# Patient Record
Sex: Female | Born: 1960 | Race: White | Hispanic: No | Marital: Married | Smoking: Never smoker
Health system: Southern US, Community
[De-identification: ages and names within clinical notes are randomized; demographics above are authoritative.]

## PROBLEM LIST (undated history)

## (undated) HISTORY — PX: RHINOPLASTY: SUR1284

---

## 2004-05-27 ENCOUNTER — Ambulatory Visit: Payer: Self-pay | Admitting: Gastroenterology

## 2008-06-22 ENCOUNTER — Ambulatory Visit: Payer: Self-pay | Admitting: Family Medicine

## 2009-09-23 ENCOUNTER — Ambulatory Visit: Payer: Self-pay | Admitting: Family Medicine

## 2010-04-08 ENCOUNTER — Ambulatory Visit: Payer: Self-pay | Admitting: Otolaryngology

## 2010-08-06 ENCOUNTER — Ambulatory Visit: Payer: Self-pay | Admitting: Family Medicine

## 2011-10-03 ENCOUNTER — Ambulatory Visit: Payer: Self-pay | Admitting: Family Medicine

## 2013-09-25 ENCOUNTER — Ambulatory Visit: Payer: Self-pay | Admitting: Family Medicine

## 2016-10-16 ENCOUNTER — Ambulatory Visit (INDEPENDENT_AMBULATORY_CARE_PROVIDER_SITE_OTHER): Payer: BC Managed Care – PPO

## 2016-10-16 ENCOUNTER — Ambulatory Visit
Admission: EM | Admit: 2016-10-16 | Discharge: 2016-10-16 | Disposition: A | Discharge status: Home / Self Care | Payer: BC Managed Care – PPO | Attending: Emergency Medicine | Admitting: Emergency Medicine

## 2016-10-16 ENCOUNTER — Ambulatory Visit: Payer: Self-pay | Admitting: Family Medicine

## 2016-10-16 DIAGNOSIS — M79662 Pain in left lower leg: Secondary | ICD-10-CM

## 2016-10-16 DIAGNOSIS — M898X6 Other specified disorders of bone, lower leg: Secondary | ICD-10-CM

## 2016-10-16 DIAGNOSIS — L237 Allergic contact dermatitis due to plants, except food: Secondary | ICD-10-CM | POA: Diagnosis not present

## 2016-10-16 DIAGNOSIS — R21 Rash and other nonspecific skin eruption: Secondary | ICD-10-CM

## 2016-10-16 MED ORDER — MUPIROCIN 2 % EX OINT: 1.0000 "application " | TOPICAL_OINTMENT | Freq: Three times a day (TID) | CUTANEOUS | 0 refills | Status: AC

## 2016-10-16 MED ORDER — IBUPROFEN 600 MG PO TABS: 600.0000 mg | ORAL_TABLET | Freq: Four times a day (QID) | ORAL | 0 refills | Status: AC | PRN

## 2016-10-16 MED ORDER — HYDROXYZINE HCL 25 MG PO TABS: 25.0000 mg | ORAL_TABLET | Freq: Four times a day (QID) | ORAL | 0 refills | Status: DC | PRN

## 2016-10-16 MED ORDER — PREDNISONE 20 MG PO TABS: ORAL_TABLET | ORAL | 0 refills | Status: DC

## 2016-10-16 NOTE — Discharge Instructions (Signed)
Use TecNu before going out in areas with known poison ivy/oak.  This will help prevent you from getting poison ivy/oak.  If you get a rash, you can use Zanfel or TecNu extreme to deactivate the oil, which will stop the rash from spreading and help with the itching.  Apply antibacterial ointment on scabbed areas to help prevent infection.  If you were given steroids, make sure you finish all of them.  You may take Claritin, Allegra, Zyrtec during the day, Benadryl at night. Dissolve 1 packet (or tablet) of Domeboro (aluminum acetate) in 1 pint of luke-warm water. Soak the affected areas with luke-warm Domeboro solution for 5-10 minutes twice daily. You may use apply gauze soaked in the domeboro.  Gently pat dry, Then apply the steriod / antibiotic cream. You may also take oatmeal baths with Aveeno oatmeal (1 cup in half full bathtub) or cornstarch/baking soda (1 cup each in half full bathtub). To prevent the oatmeal from caking in pipes, place it in a tied sock before dropping it into the bathtub.  For your ankle: Ace wrap, ice it for 20 minutes at a time, elevate it above your heart particularly at night.  Go to www.goodrx.com to look up your medications. This will give you a list of where you can find your prescriptions at the most affordable prices. Also, ask the pharmacist what the Nedra HaiC[employer] Price is. This is frequently much cheaper than going through insurance.

## 2016-10-16 NOTE — ED Triage Notes (Addendum)
Patient complains of rash that she noticed 3 days ago. Patient states that she may have got into some poision oak. Patient states that she has had some tingling sensation before rash appeared. Patient states that area keeps spreading.   Patient also reports of left ankle pain that occasionally swells x 2 weeks.

## 2016-10-16 NOTE — ED Provider Notes (Signed)
HPI  SUBJECTIVE:  Annette Young is a 56 y.o. female who presents with 2 complaints. First, she reports an itchy, burning rash 5 days ago. States it is located on her abdomen, lateral right hip and left neck. States that she has been working in the yard a lot recently. She describes the rash as like bumps and blisters . There are no alleviating factors. She has not tried anything for this. Some worse when she wears specific clothing. States it itches all day. It is not painful. No fevers, body aches, flulike symptoms, new lotions, soaps, detergents, foods. No meds, no sensation being bitten at night, blood on the bedclothes in the morning. No contacts with similar rash, no pets in the home.  Second, she reports lateral left ankle pain described as soreness, bruised, achy. She has increased her physical activity recently and is walking up to 10-12 miles a day. She states that she is functioning okay but she reports intermittent swelling laterally after a long day standing. She states the joint is nontender. No trauma, fall, erythema. Symptoms worse with plantar flexion/dorsiflexion/inversion. Symptoms are better with rest, ice. She has also tried soaking her foot in Epsom salts. States that her foot feels fine. Past medical history negative for diabetes, hypertension, history of injury to this ankle. PMD: She was a former patient of Dr. Yetta BarreJones, states that she can reestablish care with her.   History reviewed. No pertinent past medical history.  Past Surgical History:  Procedure Laterality Date  . RHINOPLASTY      Family History  Problem Relation Age of Onset  . Heart disease Mother   . Diabetes Father     Social History  Substance Use Topics  . Smoking status: Never Smoker  . Smokeless tobacco: Never Used  . Alcohol use Yes     Comment: occasionally    No current facility-administered medications for this encounter.   Current Outpatient Prescriptions:  .  mometasone (NASONEX) 50 MCG/ACT  nasal spray, Place 2 sprays into the nose daily., Disp: , Rfl:  .  hydrOXYzine (ATARAX/VISTARIL) 25 MG tablet, Take 1 tablet (25 mg total) by mouth every 6 (six) hours as needed for itching., Disp: 20 tablet, Rfl: 0 .  ibuprofen (ADVIL,MOTRIN) 600 MG tablet, Take 1 tablet (600 mg total) by mouth every 6 (six) hours as needed., Disp: 30 tablet, Rfl: 0 .  mupirocin ointment (BACTROBAN) 2 %, Apply 1 application topically 3 (three) times daily., Disp: 22 g, Rfl: 0 .  predniSONE (DELTASONE) 20 MG tablet, 3 Tabs PO Days 1-3, then 2 tabs PO Days 4-6, then 1 tab PO Day 7-9, then Half Tab PO Day 10-12, Disp: 20 tablet, Rfl: 0  Allergies  Allergen Reactions  . Chlorine      ROS  As noted in HPI.   Physical Exam  BP 136/78 (BP Location: Left Arm)   Pulse 70   Temp 98.5 F (36.9 C) (Oral)   Resp 18   Ht 5\' 3"  (1.6 m)   Wt 150 lb (68 kg)   SpO2 97%   BMI 26.57 kg/m   Constitutional: Well developed, well nourished, no acute distress Eyes:  EOMI, conjunctiva normal bilaterally HENT: Normocephalic, atraumatic,mucus membranes moist Respiratory: Normal inspiratory effort Cardiovascular: Normal rate GI: nondistended skin: Several scattered erythematous macular rash over her right thumb, abdomen, linear almost vesicular rash of her right lateral thigh. Positive papular nonerythematous rash appears almost be urticaria versus early vesicles on her left neck see pictures  Musculoskeletal:  L Ankle , Proximal fibula NT, Distal fibula tender, Medial malleolus NT,  Deltoid ligament medially NT ,  Lateral ligaments NT , ATFL laterally NT , posterior tablofibular ligament laterally NT , calcaneofibular ligament laterally NT ,  Achilles NT, calcaneus NT,  Proximal 5th metatarsal NT, Midfoot NT, distal NVI with baseline sensation / motor to foot. no pain with dorsiflexion/plantar flexion. no pain with inversion/eversion. - bruising. + Ant drawer test stable. Pt able to bear weight in  dept.  Neurologic: Alert & oriented x 3, no focal neuro deficits Psychiatric: Speech and behavior appropriate   ED Course   Medications - No data to display  Orders Placed This Encounter  Procedures  . DG Tibia/Fibula Left    Standing Status:   Standing    Number of Occurrences:   1    Order Specific Question:   Reason for Exam (SYMPTOM  OR DIAGNOSIS REQUIRED)    Answer:   distal lateral fibular tenderness r/o fx    No results found for this or any previous visit (from the past 24 hour(s)). Dg Tibia/fibula Left  Result Date: 10/16/2016 CLINICAL DATA:  Distal lateral fibular tenderness. EXAM: LEFT TIBIA AND FIBULA - 2 VIEW COMPARISON:  None. FINDINGS: There is no evidence of fracture or other focal bone lesions. Soft tissues are unremarkable. IMPRESSION: Negative. Electronically Signed   By: Charlett Nose M.D.   On: 10/16/2016 15:13    ED Clinical Impression  Allergic contact dermatitis due to plants, except food  Pain of left fibula  ED Assessment/Plan   1: Contact dermatitis. Home with 12 days prednisone, Atarax at night, or she can try Claritin or Zyrtec  2: Distal fibular pain. We'll x-ray to rule out any stress fractures given her increased physical activity. Her ankle is completely nontender to palpation and with range of motion deferring imaging of this. Advised Ace wrap, elevation, ice for 20 minutes at a time at the end of the long day when it is swollen, will send home with 600 mg ibuprofen with 1 g of Tylenol.  Imaging independently reviewed. No fracture or dislocation. No focal bone lesions . See radiology report for details.  Patient to reestablish care with Dr. Yetta Barre.  Discussed imaging, MDM, plan and followup with patient.  Patient agrees with plan.   Meds ordered this encounter  Medications  . mometasone (NASONEX) 50 MCG/ACT nasal spray    Sig: Place 2 sprays into the nose daily.  . predniSONE (DELTASONE) 20 MG tablet    Sig: 3 Tabs PO Days 1-3, then 2  tabs PO Days 4-6, then 1 tab PO Day 7-9, then Half Tab PO Day 10-12    Dispense:  20 tablet    Refill:  0  . hydrOXYzine (ATARAX/VISTARIL) 25 MG tablet    Sig: Take 1 tablet (25 mg total) by mouth every 6 (six) hours as needed for itching.    Dispense:  20 tablet    Refill:  0  . mupirocin ointment (BACTROBAN) 2 %    Sig: Apply 1 application topically 3 (three) times daily.    Dispense:  22 g    Refill:  0  . ibuprofen (ADVIL,MOTRIN) 600 MG tablet    Sig: Take 1 tablet (600 mg total) by mouth every 6 (six) hours as needed.    Dispense:  30 tablet    Refill:  0    *This clinic note was created using Scientist, clinical (histocompatibility and immunogenetics). Therefore, there may be occasional mistakes despite careful  proofreading.  ?   Domenick Gong, MD 10/16/16 1645

## 2017-03-08 ENCOUNTER — Other Ambulatory Visit: Payer: Self-pay | Admitting: Obstetrics and Gynecology

## 2017-03-08 DIAGNOSIS — Z1239 Encounter for other screening for malignant neoplasm of breast: Secondary | ICD-10-CM

## 2017-04-04 ENCOUNTER — Ambulatory Visit
Admission: RE | Admit: 2017-04-04 | Discharge: 2017-04-04 | Disposition: A | Discharge status: Home / Self Care | Payer: BC Managed Care – PPO | Source: Ambulatory Visit | Attending: Obstetrics and Gynecology | Admitting: Obstetrics and Gynecology

## 2017-04-04 ENCOUNTER — Encounter (INDEPENDENT_AMBULATORY_CARE_PROVIDER_SITE_OTHER): Payer: Self-pay

## 2017-04-04 DIAGNOSIS — Z1231 Encounter for screening mammogram for malignant neoplasm of breast: Secondary | ICD-10-CM | POA: Insufficient documentation

## 2017-04-04 DIAGNOSIS — Z1239 Encounter for other screening for malignant neoplasm of breast: Secondary | ICD-10-CM

## 2017-04-11 ENCOUNTER — Other Ambulatory Visit: Payer: Self-pay | Admitting: Obstetrics and Gynecology

## 2017-04-11 DIAGNOSIS — N6489 Other specified disorders of breast: Secondary | ICD-10-CM

## 2017-04-11 DIAGNOSIS — R928 Other abnormal and inconclusive findings on diagnostic imaging of breast: Secondary | ICD-10-CM

## 2017-04-19 ENCOUNTER — Ambulatory Visit
Admission: RE | Admit: 2017-04-19 | Discharge: 2017-04-19 | Disposition: A | Discharge status: Home / Self Care | Payer: BC Managed Care – PPO | Source: Ambulatory Visit | Attending: Obstetrics and Gynecology | Admitting: Obstetrics and Gynecology

## 2017-04-19 DIAGNOSIS — R928 Other abnormal and inconclusive findings on diagnostic imaging of breast: Secondary | ICD-10-CM

## 2017-04-19 DIAGNOSIS — N6489 Other specified disorders of breast: Secondary | ICD-10-CM

## 2018-03-07 DIAGNOSIS — B009 Herpesviral infection, unspecified: Secondary | ICD-10-CM | POA: Insufficient documentation

## 2018-03-20 ENCOUNTER — Other Ambulatory Visit: Payer: Self-pay | Admitting: Family Medicine

## 2018-03-20 DIAGNOSIS — Z1231 Encounter for screening mammogram for malignant neoplasm of breast: Secondary | ICD-10-CM

## 2018-05-08 ENCOUNTER — Ambulatory Visit
Admission: RE | Admit: 2018-05-08 | Discharge: 2018-05-08 | Disposition: A | Discharge status: Home / Self Care | Payer: BC Managed Care – PPO | Source: Ambulatory Visit | Attending: Family Medicine | Admitting: Family Medicine

## 2018-05-08 DIAGNOSIS — Z1231 Encounter for screening mammogram for malignant neoplasm of breast: Secondary | ICD-10-CM | POA: Insufficient documentation

## 2018-11-07 ENCOUNTER — Other Ambulatory Visit: Payer: Self-pay | Admitting: Family Medicine

## 2018-11-07 DIAGNOSIS — Z20822 Contact with and (suspected) exposure to covid-19: Secondary | ICD-10-CM

## 2018-11-12 LAB — NOVEL CORONAVIRUS, NAA: SARS-CoV-2, NAA: NOT DETECTED

## 2019-03-20 ENCOUNTER — Other Ambulatory Visit: Payer: Self-pay | Admitting: Obstetrics and Gynecology

## 2019-03-20 DIAGNOSIS — Z1231 Encounter for screening mammogram for malignant neoplasm of breast: Secondary | ICD-10-CM

## 2019-05-12 ENCOUNTER — Ambulatory Visit
Admission: RE | Admit: 2019-05-12 | Discharge: 2019-05-12 | Disposition: A | Discharge status: Home / Self Care | Payer: BC Managed Care – PPO | Source: Ambulatory Visit | Attending: Obstetrics and Gynecology | Admitting: Obstetrics and Gynecology

## 2019-05-12 ENCOUNTER — Other Ambulatory Visit: Payer: Self-pay

## 2019-05-12 DIAGNOSIS — Z1231 Encounter for screening mammogram for malignant neoplasm of breast: Secondary | ICD-10-CM | POA: Diagnosis present

## 2021-10-11 IMAGING — MG DIGITAL SCREENING BILAT W/ TOMO W/ CAD
8 series · 8 of 24 positions shown · non-contrast
Comparison: Previous exam(s).

CLINICAL DATA: Screening.

EXAM:
DIGITAL SCREENING BILATERAL MAMMOGRAM WITH TOMO AND CAD

[R CC synth-2D]
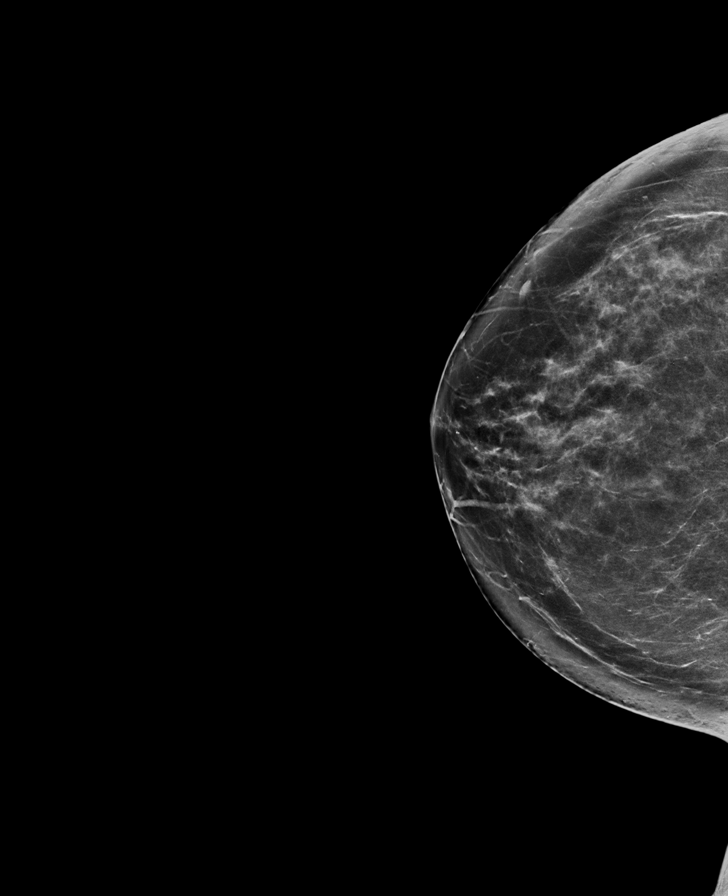

[L MLO synth-2D]
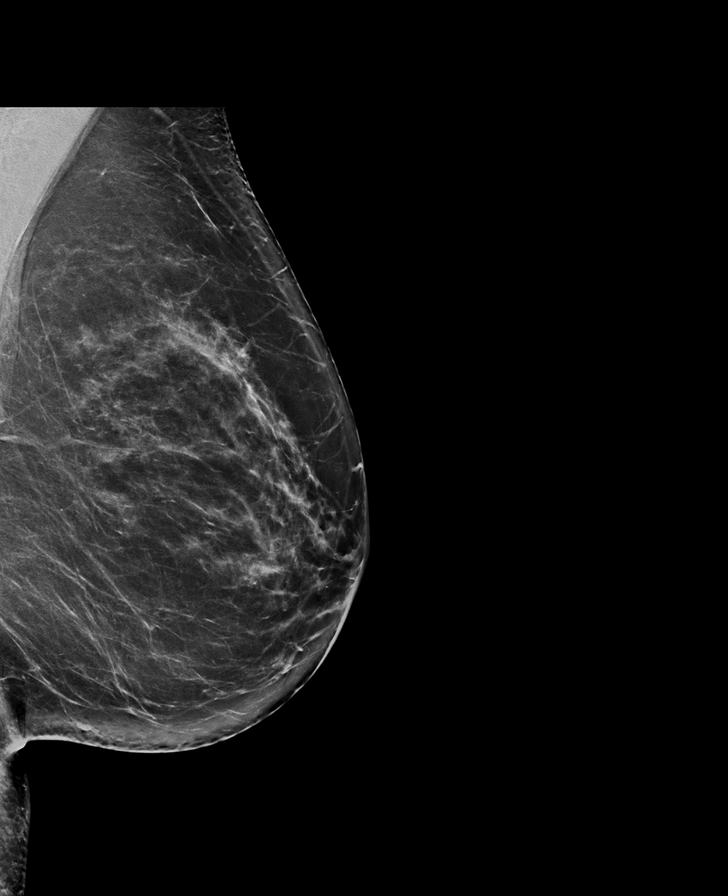

[R MLO synth-2D]
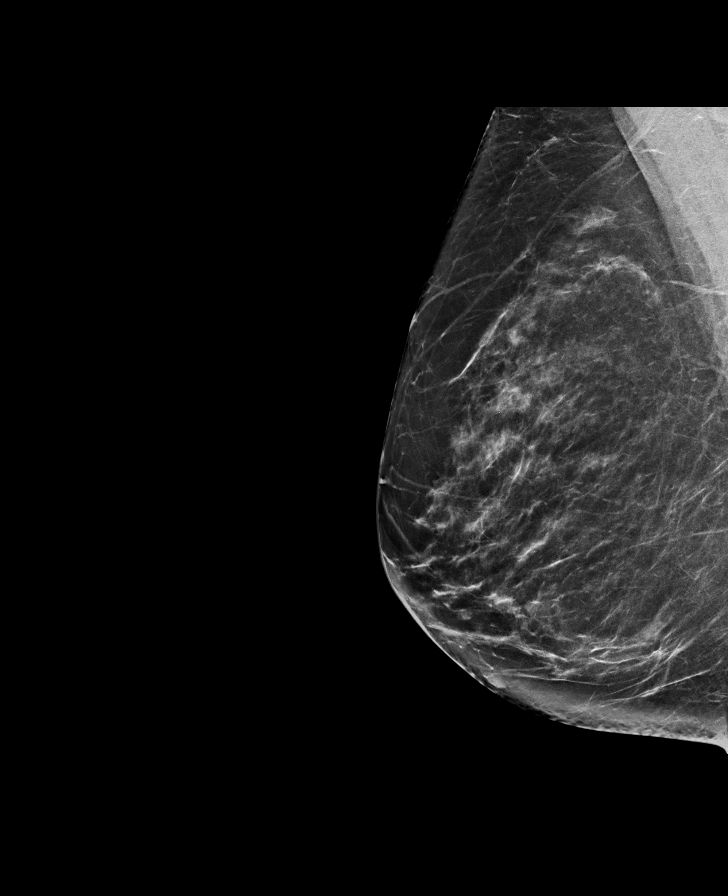

[L CC synth-2D]
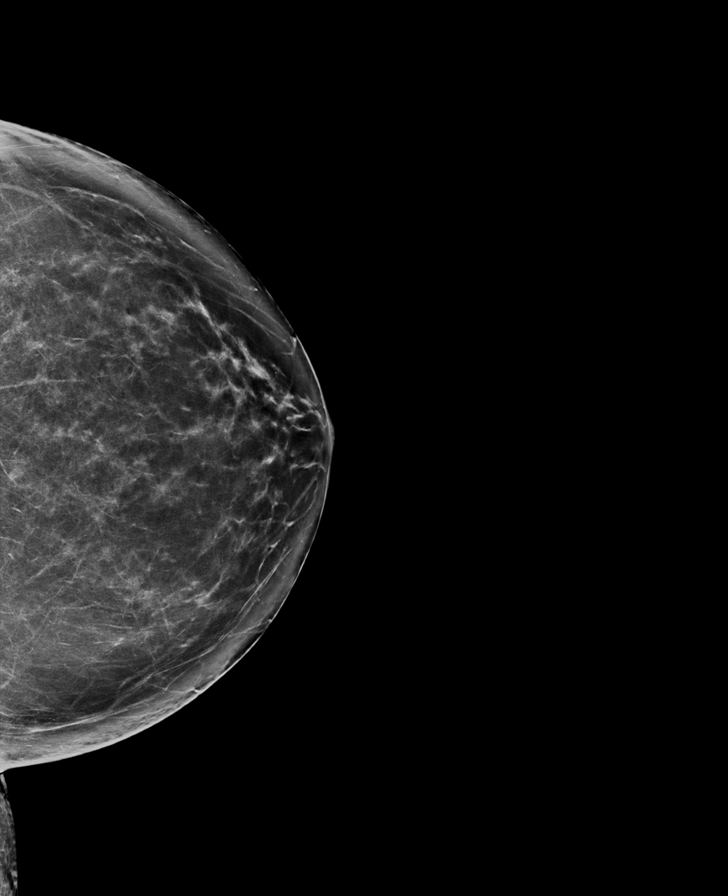

[L CC tomo · tomo slice 42/83.0]
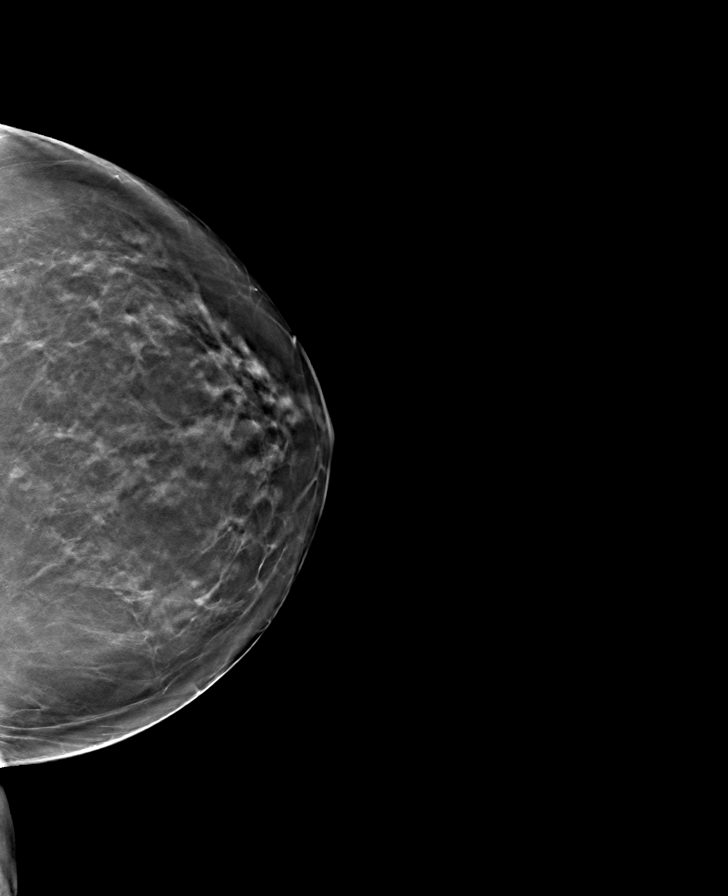

[R CC tomo · tomo slice 41/81.0]
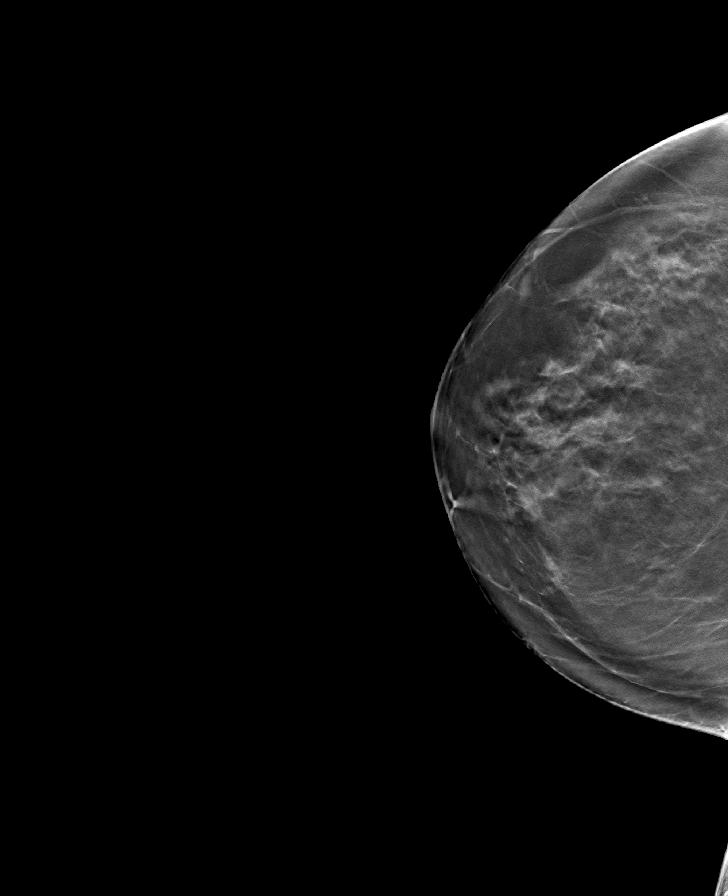

[L MLO tomo · tomo slice 43/84.0]
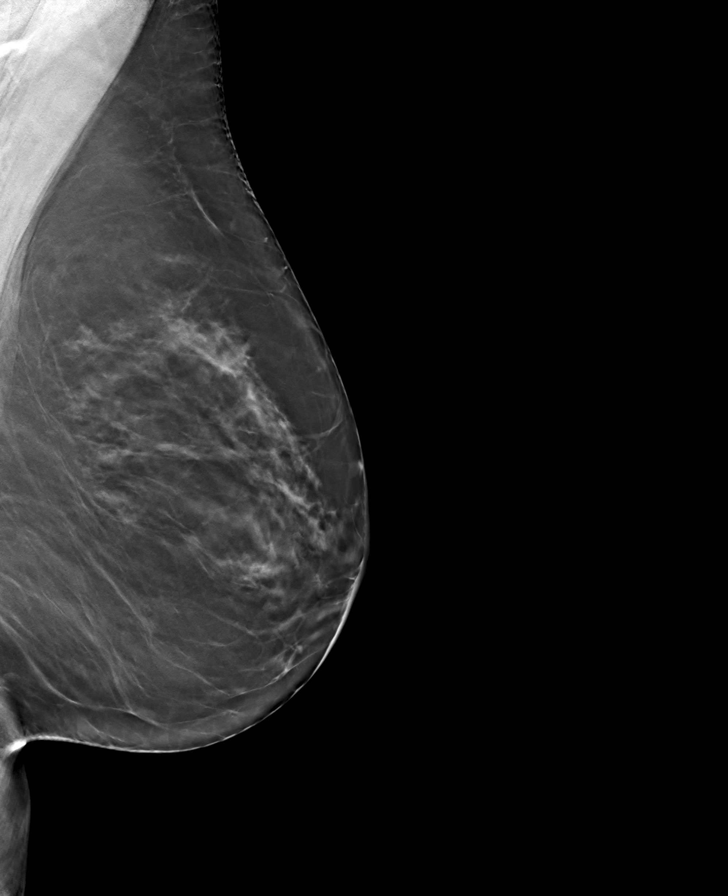

[R MLO tomo · tomo slice 41/82.0]
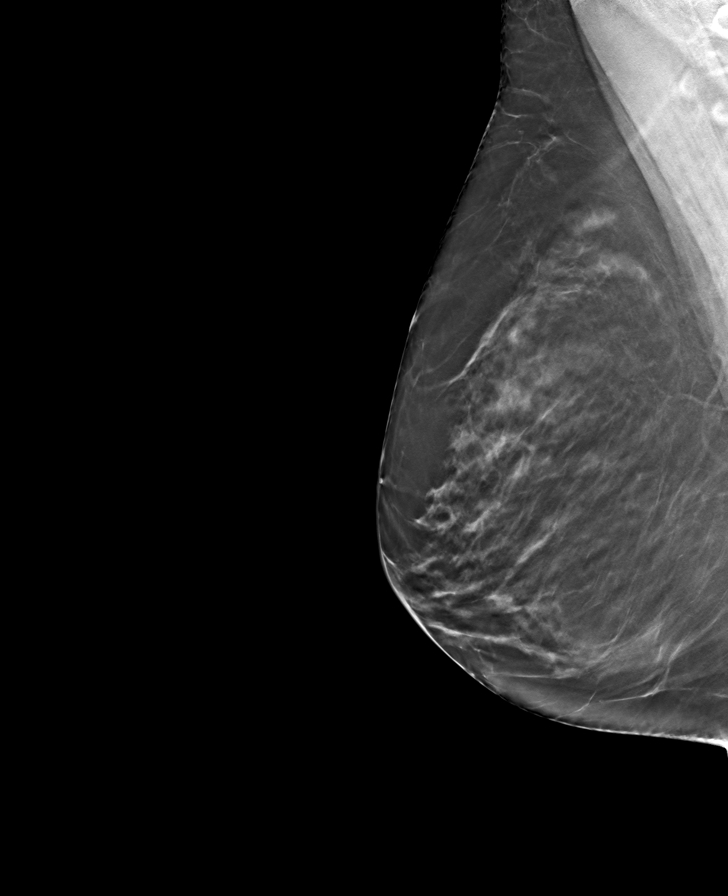

[8 of 24 positions shown; findings below may reference images not displayed]

ACR Breast Density Category b: There are scattered areas of
fibroglandular density.
FINDINGS: There are no findings suspicious for malignancy. Images were
processed with CAD.
IMPRESSION: No mammographic evidence of malignancy. A result letter of this
screening mammogram will be mailed directly to the patient.

RECOMMENDATION:
Screening mammogram in one year. (Code:[id number]-775)

BI-RADS CATEGORY  1: Negative.

## 2022-05-09 ENCOUNTER — Other Ambulatory Visit: Payer: Self-pay

## 2022-05-09 DIAGNOSIS — Z1231 Encounter for screening mammogram for malignant neoplasm of breast: Secondary | ICD-10-CM

## 2022-09-26 ENCOUNTER — Ambulatory Visit
Admission: RE | Admit: 2022-09-26 | Discharge: 2022-09-26 | Disposition: A | Discharge status: Home / Self Care | Payer: BC Managed Care – PPO | Source: Ambulatory Visit | Attending: Obstetrics and Gynecology | Admitting: Obstetrics and Gynecology

## 2022-09-26 DIAGNOSIS — Z1231 Encounter for screening mammogram for malignant neoplasm of breast: Secondary | ICD-10-CM

## 2022-10-25 ENCOUNTER — Encounter: Payer: Self-pay | Admitting: *Deleted

## 2023-08-13 ENCOUNTER — Other Ambulatory Visit: Payer: Self-pay | Admitting: Obstetrics and Gynecology

## 2023-08-13 DIAGNOSIS — Z1231 Encounter for screening mammogram for malignant neoplasm of breast: Secondary | ICD-10-CM

## 2023-09-26 ENCOUNTER — Telehealth: Payer: Self-pay

## 2023-09-26 NOTE — Telephone Encounter (Signed)
 Pt requesting call back to reschedule colonoscopy

## 2023-09-27 ENCOUNTER — Telehealth: Payer: Self-pay

## 2023-09-27 ENCOUNTER — Ambulatory Visit
Admission: RE | Admit: 2023-09-27 | Discharge: 2023-09-27 | Disposition: A | Discharge status: Home / Self Care | Payer: Self-pay | Source: Ambulatory Visit | Attending: Obstetrics and Gynecology | Admitting: Obstetrics and Gynecology

## 2023-09-27 ENCOUNTER — Other Ambulatory Visit: Payer: Self-pay

## 2023-09-27 DIAGNOSIS — Z1211 Encounter for screening for malignant neoplasm of colon: Secondary | ICD-10-CM

## 2023-09-27 DIAGNOSIS — E785 Hyperlipidemia, unspecified: Secondary | ICD-10-CM | POA: Insufficient documentation

## 2023-09-27 DIAGNOSIS — R195 Other fecal abnormalities: Secondary | ICD-10-CM

## 2023-09-27 DIAGNOSIS — Z1231 Encounter for screening mammogram for malignant neoplasm of breast: Secondary | ICD-10-CM | POA: Insufficient documentation

## 2023-09-27 MED ORDER — NA SULFATE-K SULFATE-MG SULF 17.5-3.13-1.6 GM/177ML PO SOLN: 1.0000 | Freq: Once | ORAL | 0 refills | Status: AC

## 2023-09-27 NOTE — Telephone Encounter (Signed)
 Gastroenterology Pre-Procedure Review  Request Date: 10/16/23 Requesting Physician: Dr. Ole Berkeley  PATIENT REVIEW QUESTIONS: The patient responded to the following health history questions as indicated:    1. Are you having any GI issues? Positive cologuard, 1st colonoscopy 2. Do you have a personal history of Polyps? no 3. Do you have a family history of Colon Cancer or Polyps? no 4. Diabetes Mellitus? no 5. Joint replacements in the past 12 months?no 6. Major health problems in the past 3 months?no 7. Any artificial heart valves, MVP, or defibrillator?no    MEDICATIONS & ALLERGIES:    Patient reports the following regarding taking any anticoagulation/antiplatelet therapy:   Plavix, Coumadin, Eliquis, Xarelto, Lovenox, Pradaxa, Brilinta, or Effient? no Aspirin? no  Patient confirms/reports the following medications:  Current Outpatient Medications  Medication Sig Dispense Refill   Na Sulfate-K Sulfate-Mg Sulfate concentrate (SUPREP) 17.5-3.13-1.6 GM/177ML SOLN Take 1 kit (354 mLs total) by mouth once for 1 dose. 354 mL 0   hydrOXYzine  (ATARAX /VISTARIL ) 25 MG tablet Take 1 tablet (25 mg total) by mouth every 6 (six) hours as needed for itching. 20 tablet 0   ibuprofen  (ADVIL ,MOTRIN ) 600 MG tablet Take 1 tablet (600 mg total) by mouth every 6 (six) hours as needed. 30 tablet 0   mometasone (NASONEX) 50 MCG/ACT nasal spray Place 2 sprays into the nose daily.     mupirocin  ointment (BACTROBAN ) 2 % Apply 1 application topically 3 (three) times daily. 22 g 0   predniSONE  (DELTASONE ) 20 MG tablet 3 Tabs PO Days 1-3, then 2 tabs PO Days 4-6, then 1 tab PO Day 7-9, then Half Tab PO Day 10-12 20 tablet 0   No current facility-administered medications for this visit.    Patient confirms/reports the following allergies:  Allergies  Allergen Reactions   Chlorine     No orders of the defined types were placed in this encounter.   AUTHORIZATION INFORMATION Primary Insurance: 1D#: Group  #:  Secondary Insurance: 1D#: Group #:  SCHEDULE INFORMATION: Date: 10/16/23 Time: Location: [facility]

## 2023-09-27 NOTE — Telephone Encounter (Signed)
 Phone call returned to schedule patient for her colonoscopy.  LVM for her to call my desk to schedule.  Thanks, Chitina, CMA

## 2023-10-03 NOTE — Telephone Encounter (Signed)
 okay

## 2023-10-09 ENCOUNTER — Encounter: Payer: Self-pay | Admitting: Gastroenterology

## 2023-10-16 ENCOUNTER — Encounter
Admission: RE | Disposition: A | Discharge status: Home / Self Care | Payer: Self-pay | Source: Home / Self Care | Attending: Gastroenterology

## 2023-10-16 ENCOUNTER — Ambulatory Visit
Admission: RE | Admit: 2023-10-16 | Discharge: 2023-10-16 | Disposition: A | Discharge status: Home / Self Care | Attending: Gastroenterology | Admitting: Gastroenterology

## 2023-10-16 ENCOUNTER — Encounter: Payer: Self-pay | Admitting: Gastroenterology

## 2023-10-16 ENCOUNTER — Other Ambulatory Visit: Payer: Self-pay

## 2023-10-16 ENCOUNTER — Ambulatory Visit

## 2023-10-16 DIAGNOSIS — D124 Benign neoplasm of descending colon: Secondary | ICD-10-CM | POA: Insufficient documentation

## 2023-10-16 DIAGNOSIS — K635 Polyp of colon: Secondary | ICD-10-CM | POA: Diagnosis not present

## 2023-10-16 DIAGNOSIS — R195 Other fecal abnormalities: Secondary | ICD-10-CM | POA: Diagnosis not present

## 2023-10-16 DIAGNOSIS — K641 Second degree hemorrhoids: Secondary | ICD-10-CM | POA: Insufficient documentation

## 2023-10-16 DIAGNOSIS — Z1211 Encounter for screening for malignant neoplasm of colon: Secondary | ICD-10-CM | POA: Insufficient documentation

## 2023-10-16 SURGERY — COLONOSCOPY
Anesthesia: General

## 2023-10-16 MED ORDER — LIDOCAINE HCL (CARDIAC) PF 100 MG/5ML IV SOSY
PREFILLED_SYRINGE | INTRAVENOUS | Status: DC | PRN
  Administered 2023-10-16: 50 mg via INTRAVENOUS

## 2023-10-16 MED ORDER — SODIUM CHLORIDE 0.9 % IV SOLN: INTRAVENOUS | Status: DC

## 2023-10-16 MED ORDER — PROPOFOL 10 MG/ML IV BOLUS
INTRAVENOUS | Status: DC | PRN
  Administered 2023-10-16 (×2): 50 mg via INTRAVENOUS

## 2023-10-16 MED ORDER — PROPOFOL 10 MG/ML IV BOLUS
INTRAVENOUS | Status: AC
  Filled 2023-10-16: qty 40

## 2023-10-16 MED ORDER — PROPOFOL 500 MG/50ML IV EMUL
INTRAVENOUS | Status: DC | PRN
  Administered 2023-10-16: 150 ug/kg/min via INTRAVENOUS

## 2023-10-16 NOTE — Op Note (Signed)
 [facility]: [facility] Patient Name: Annette Young Procedure Date: 10/16/2023 8:30 AM MRN: [id number] Account #: [id number] Date of Birth: November 06, 1960 Admit Type: Outpatient Age: 63 Room: [facility] ENDO ROOM 4 Gender: Female Note Status: Finalized Instrument Name: Charlyn Cooley [id number] Procedure:             Colonoscopy Indications:           Incidental - Positive Cologuard test, Screening for                         colorectal malignant neoplasm Providers:             [name] MD, MD Referring MD:          Clarise Crooks, MD (Referring MD) Medicines:             Propofol per Anesthesia Complications:         No immediate complications. Procedure:             Pre-Anesthesia Assessment:                        - Prior to the procedure, a History and Physical was                         performed, and patient medications and allergies were                         reviewed. The patient's tolerance of previous                         anesthesia was also reviewed. The risks and benefits                         of the procedure and the sedation options and risks                         were discussed with the patient. All questions were                         answered, and informed consent was obtained. Prior                         Anticoagulants: The patient has taken no anticoagulant                         or antiplatelet agents. ASA Grade Assessment: II - A                         patient with mild systemic disease. After reviewing                         the risks and benefits, the patient was deemed in                         satisfactory condition to undergo the procedure.                        After obtaining informed consent, the colonoscope was  passed under direct vision. Throughout the procedure,                         the patient's blood pressure, pulse, and oxygen                         saturations were monitored continuously. The                          Colonoscope was introduced through the anus and                         advanced to the the cecum, identified by appendiceal                         orifice and ileocecal valve. The colonoscopy was                         performed without difficulty. The patient tolerated                         the procedure well. The quality of the bowel                         preparation was excellent. Findings:      The perianal and digital rectal examinations were normal.      Five sessile polyps were found in the descending colon. The polyps were       3 to 6 mm in size. These polyps were removed with a cold snare.       Resection and retrieval were complete.      Non-bleeding internal hemorrhoids were found during retroflexion. The       hemorrhoids were Grade II (internal hemorrhoids that prolapse but reduce       spontaneously). Impression:            - Five 3 to 6 mm polyps in the descending colon,                         removed with a cold snare. Resected and retrieved.                        - Non-bleeding internal hemorrhoids. Recommendation:        - Discharge patient to home.                        - Resume previous diet.                        - Continue present medications.                        - Await pathology results.                        - If the pathology report reveals adenomatous tissue,                         then repeat the colonoscopy for surveillance in 3  years. Procedure Code(s):     --- Professional ---                        [phone], Colonoscopy, flexible; with removal of                         tumor(s), polyp(s), or other lesion(s) by snare                         technique Diagnosis Code(s):     --- Professional ---                        Z12.11, Encounter for screening for malignant neoplasm                         of colon                        D12.4, Benign neoplasm of descending colon CPT copyright 2022  American Medical Association. All rights reserved. The codes documented in this report are preliminary and upon coder review may  be revised to meet current compliance requirements. [name] MD, MD 10/16/2023 9:00:40 AM This report has been signed electronically. Number of Addenda: 0 Note Initiated On: 10/16/2023 8:30 AM Scope Withdrawal Time: 0 hours 6 minutes 22 seconds  Total Procedure Duration: 0 hours 14 minutes 13 seconds  Estimated Blood Loss:  Estimated blood loss: none.      [facility]

## 2023-10-16 NOTE — Transfer of Care (Signed)
 Immediate Anesthesia Transfer of Care Note  Patient: Annette Young  Procedure(s) Performed: COLONOSCOPY POLYPECTOMY, INTESTINE  Patient Location: PACU  Anesthesia Type:MAC  Level of Consciousness: awake  Airway & Oxygen Therapy: Patient Spontanous Breathing  Post-op Assessment: Report given to RN and Post -op Vital signs reviewed and stable  Post vital signs: Reviewed and stable  Last Vitals:  Vitals Value Taken Time  BP 102/58 10/16/23 09:03  Temp 35.8 C 10/16/23 09:02  Pulse 57 10/16/23 09:03  Resp 16 10/16/23 09:03  SpO2 98 % 10/16/23 09:03  Vitals shown include unfiled device data.  Last Pain:  Vitals:   10/16/23 0902  TempSrc: Temporal  PainSc: Asleep         Complications: No notable events documented.

## 2023-10-16 NOTE — Anesthesia Preprocedure Evaluation (Signed)
 Anesthesia Evaluation  Patient identified by MRN, date of birth, ID band Patient awake    Reviewed: Allergy & Precautions, NPO status , Patient's Chart, lab work & pertinent test results  History of Anesthesia Complications Negative for: history of anesthetic complications  Airway Mallampati: II  TM Distance: >3 FB Neck ROM: Full    Dental no notable dental hx. (+) Teeth Intact   Pulmonary neg pulmonary ROS, neg sleep apnea, neg COPD, Patient abstained from smoking.Not current smoker   Pulmonary exam normal breath sounds clear to auscultation       Cardiovascular Exercise Tolerance: Good METS(-) hypertension(-) CAD and (-) Past MI negative cardio ROS (-) dysrhythmias  Rhythm:Regular Rate:Normal - Systolic murmurs    Neuro/Psych negative neurological ROS  negative psych ROS   GI/Hepatic ,neg GERD  ,,(+)     (-) substance abuse    Endo/Other  neg diabetes    Renal/GU negative Renal ROS     Musculoskeletal   Abdominal   Peds  Hematology   Anesthesia Other Findings History reviewed. No pertinent past medical history.  Reproductive/Obstetrics                             Anesthesia Physical Anesthesia Plan  ASA: 1  Anesthesia Plan: General   Post-op Pain Management: Minimal or no pain anticipated   Induction: Intravenous  PONV Risk Score and Plan: 3 and Propofol infusion, TIVA and Ondansetron  Airway Management Planned: Nasal Cannula  Additional Equipment: None  Intra-op Plan:   Post-operative Plan:   Informed Consent: I have reviewed the patients History and Physical, chart, labs and discussed the procedure including the risks, benefits and alternatives for the proposed anesthesia with the patient or authorized representative who has indicated his/her understanding and acceptance.     Dental advisory given  Plan Discussed with: CRNA and Surgeon  Anesthesia Plan Comments:  (Discussed risks of anesthesia with patient, including possibility of difficulty with spontaneous ventilation under anesthesia necessitating airway intervention, PONV, and rare risks such as cardiac or respiratory or neurological events, and allergic reactions. Discussed the role of CRNA in patient's perioperative care. Patient understands.)       Anesthesia Quick Evaluation

## 2023-10-16 NOTE — H&P (Signed)
 Annette Sink, MD [facility] [address] [place], [place] [address] Phone: [phone] Fax : [phone]  Primary Care Physician:  Idamae Maize, FNP Primary Gastroenterologist:  Dr. Ole Berkeley  Pre-Procedure History & Physical: HPI:  Annette Young is a 63 y.o. female is here for a screening colonoscopy.   History reviewed. No pertinent past medical history.  Past Surgical History:  Procedure Laterality Date   RHINOPLASTY      Prior to Admission medications   Medication Sig Start Date End Date Taking? Authorizing Provider  mometasone (NASONEX) 50 MCG/ACT nasal spray Place 2 sprays into the nose daily.   Yes [provider]  Estradiol (VAGIFEM) 10 MCG TABS vaginal tablet  05/03/22   [provider]  hydrocortisone 2.5 % cream APPLY SPARINGLY TO AFFECTED AREA 2 TO 4 TIMES A DAY Patient not taking: Reported on 09/27/2023 03/18/19   [provider]  ibuprofen  (ADVIL ,MOTRIN ) 600 MG tablet Take 1 tablet (600 mg total) by mouth every 6 (six) hours as needed. Patient not taking: Reported on 09/27/2023 10/16/16   Ethlyn Herd, MD  mupirocin  ointment (BACTROBAN ) 2 % Apply 1 application topically 3 (three) times daily. Patient not taking: Reported on 09/27/2023 10/16/16   Ethlyn Herd, MD  Red Yeast Rice Extract 600 MG CAPS  03/01/18   [provider]  valACYclovir (VALTREX) 500 MG tablet Take 500 mg by mouth. 03/02/19   [provider]    Allergies as of 09/27/2023 - Review Complete 10/16/2016  Allergen Reaction Noted   Chlorine  10/16/2016    Family History  Problem Relation Age of Onset   Heart disease Mother    Diabetes Father    Breast cancer Neg Hx     Social History   Socioeconomic History   Marital status: Married    Spouse name: Not on file   Number of children: Not on file   Years of education: Not on file   Highest education level: Not on file  Occupational History   Not on file  Tobacco Use   Smoking status: Never    Smokeless tobacco: Never  Vaping Use   Vaping status: Never Used  Substance and Sexual Activity   Alcohol use: Yes    Comment: occasionally   Drug use: No   Sexual activity: Not on file  Other Topics Concern   Not on file  Social History Narrative   Not on file   Social Drivers of Health   Financial Resource Strain: Low Risk  (09/23/2023)   Received from [facility]   Overall Financial Resource Strain (CARDIA)    Difficulty of Paying Living Expenses: Not very hard  Food Insecurity: No Food Insecurity (09/23/2023)   Received from [facility]   Hunger Vital Sign    Within the past 12 months, you worried that your food would run out before you got the money to buy more.: Never true    Within the past 12 months, the food you bought just didn't last and you didn't have money to get more.: Never true  Transportation Needs: No Transportation Needs (09/23/2023)   Received from [facility] - Transportation    In the past 12 months, has lack of transportation kept you from medical appointments or from getting medications?: No    Lack of Transportation (Non-Medical): No  Physical Activity: Insufficiently Active (03/28/2020)   Received from [facility]   Exercise Vital Sign  On average, how many days per week do you engage in moderate to strenuous exercise (like a brisk walk)?: 2 days    On average, how many minutes do you engage in exercise at this level?: 50 min  Stress: No Stress Concern Present (03/28/2020)   Received from [facility] - Occupational Stress Questionnaire    Feeling of Stress : Only a little  Social Connections: Socially Integrated (03/28/2020)   Received from [facility]   Social Connection and Isolation Panel    In a typical week, how many times do you talk on the phone with family, friends, or neighbors?:  Twice a week    How often do you get together with friends or relatives?: More than three times a week    How often do you attend church or religious services?: 1 to 4 times per year    Do you belong to any clubs or organizations such as church groups, unions, fraternal or athletic groups, or school groups?: Yes    How often do you attend meetings of the clubs or organizations you belong to?: More than 4 times per year    Are you married, widowed, divorced, separated, never married, or living with a partner?: Married  Intimate Partner Violence: Not on file    Review of Systems: See HPI, otherwise negative ROS  Physical Exam: There were no vitals taken for this visit. General:   Alert,  pleasant and cooperative in NAD Head:  Normocephalic and atraumatic. Neck:  Supple; no masses or thyromegaly. Lungs:  Clear throughout to auscultation.    Heart:  Regular rate and rhythm. Abdomen:  Soft, nontender and nondistended. Normal bowel sounds, without guarding, and without rebound.   Neurologic:  Alert and  oriented x4;  grossly normal neurologically.  Impression/Plan: Annette Young is now here to undergo a screening colonoscopy.  Risks, benefits, and alternatives regarding colonoscopy have been reviewed with the patient.  Questions have been answered.  All parties agreeable.

## 2023-10-16 NOTE — Anesthesia Postprocedure Evaluation (Signed)
 Anesthesia Post Note  Patient: [name]  Procedure(s) Performed: COLONOSCOPY POLYPECTOMY, INTESTINE  Patient location during evaluation: Endoscopy Anesthesia Type: General Level of consciousness: awake and alert Pain management: pain level controlled Vital Signs Assessment: post-procedure vital signs reviewed and stable Respiratory status: spontaneous breathing, nonlabored ventilation, respiratory function stable and patient connected to nasal cannula oxygen Cardiovascular status: blood pressure returned to baseline and stable Postop Assessment: no apparent nausea or vomiting Anesthetic complications: no   No notable events documented.   Last Vitals:  Vitals:   10/16/23 0913 10/16/23 0923  BP: 125/70 135/70  Pulse: (!) 56 (!) 57  Resp: 20 18  Temp:    SpO2: 97% 99%    Last Pain:  Vitals:   10/16/23 0923  TempSrc:   PainSc: 0-No pain                 Lattie Poli

## 2023-10-17 LAB — SURGICAL PATHOLOGY

## 2023-10-18 ENCOUNTER — Ambulatory Visit: Payer: Self-pay | Admitting: Gastroenterology
